# Patient Record
Sex: Female | Born: 2000 | Race: White | Hispanic: Yes | Marital: Single | Smoking: Never smoker
Health system: Southern US, Community
[De-identification: ages and names within clinical notes are randomized; demographics above are authoritative.]

## PROBLEM LIST (undated history)

## (undated) DIAGNOSIS — F32A Depression, unspecified: Secondary | ICD-10-CM

## (undated) DIAGNOSIS — F419 Anxiety disorder, unspecified: Secondary | ICD-10-CM

## (undated) HISTORY — PX: WISDOM TOOTH EXTRACTION: SHX21

## (undated) HISTORY — DX: Depression, unspecified: F32.A

## (undated) HISTORY — DX: Anxiety disorder, unspecified: F41.9

---
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
## 2021-08-06 DIAGNOSIS — H0288A Meibomian gland dysfunction right eye, upper and lower eyelids: Secondary | ICD-10-CM | POA: Diagnosis not present

## 2021-08-06 DIAGNOSIS — H16223 Keratoconjunctivitis sicca, not specified as Sjogren's, bilateral: Secondary | ICD-10-CM | POA: Diagnosis not present

## 2021-08-06 DIAGNOSIS — H0288B Meibomian gland dysfunction left eye, upper and lower eyelids: Secondary | ICD-10-CM | POA: Diagnosis not present

## 2021-09-26 DIAGNOSIS — Z23 Encounter for immunization: Secondary | ICD-10-CM | POA: Diagnosis not present

## 2021-11-30 DIAGNOSIS — M940 Chondrocostal junction syndrome [Tietze]: Secondary | ICD-10-CM | POA: Diagnosis not present

## 2021-12-09 DIAGNOSIS — Z23 Encounter for immunization: Secondary | ICD-10-CM | POA: Diagnosis not present

## 2022-06-10 DIAGNOSIS — Z124 Encounter for screening for malignant neoplasm of cervix: Secondary | ICD-10-CM | POA: Diagnosis not present

## 2022-06-10 DIAGNOSIS — Z6820 Body mass index (BMI) 20.0-20.9, adult: Secondary | ICD-10-CM | POA: Diagnosis not present

## 2022-06-10 DIAGNOSIS — Z01419 Encounter for gynecological examination (general) (routine) without abnormal findings: Secondary | ICD-10-CM | POA: Diagnosis not present

## 2022-06-10 DIAGNOSIS — R87610 Atypical squamous cells of undetermined significance on cytologic smear of cervix (ASC-US): Secondary | ICD-10-CM | POA: Diagnosis not present

## 2022-06-10 DIAGNOSIS — Z113 Encounter for screening for infections with a predominantly sexual mode of transmission: Secondary | ICD-10-CM | POA: Diagnosis not present

## 2022-06-30 LAB — HM PAP SMEAR
Chlamydia, Swab/Urine, PCR: NEGATIVE
HM Pap smear: ABNORMAL
HPV, high-risk: NEGATIVE

## 2022-07-15 NOTE — Progress Notes (Signed)
Subjective:    Sarah Holt is a 22 y.o. female and is here for a comprehensive physical exam.  HPI  Health Maintenance Due  Topic Date Due   HPV VACCINES (1 - 2-dose series) Never done   HIV Screening  Never done   Hepatitis C Screening  Never done   DTaP/Tdap/Td (1 - Tdap) Never done    Acute Concerns: None  Chronic Issues: Depression Treated with fluoxetine 40 mg daily.  No negative side effects but believes she no longer needs this. Would like to taper this and eventually stop. Denies SI/HI  Asthma Longstanding history since elementary school. Treated with albuterol inhaler as needed. Uses sparingly if outside for long periods, exercising.  Chest pain Has a history of chest pain episodes that last minutes usually and hours rarely. Rates pain at 2-3/10. Seen at urgent care for costochondritis 11/2021. During this episode she had associated dizziness, lightheadedness but believes this was due to anxiety. No related trauma.  Fatigue; Syncope Still having issues with this. Has had 3 episodes of syncope in the past 5 years. Episodes last less than a minute. No associated triggers. Has accompanying lightheadedness. Hydration is adequate. Seen by neurologist in the past. Took Adderall at one point but stopped due to associated hyperactivity. No known cardiac issues.  Will be sending vaccine records. Requesting refill of [name]. Does not see dermatologist regularly- no concerns. Does not wear sunscreen daily. Denies unusual headaches, swelling in ankles.  Health Maintenance: Immunizations -- Administered tetanus vaccine. Colonoscopy -- N/A Mammogram -- N/A PAP -- Due for first screening. Bone Density -- N/A Diet -- Healthy. Exercise -- Does cardio, weight training.  Sleep habits -- Stable Mood -- Stable  UTD with dentist? - UTD UTD with eye doctor? - UTD  Weight history: Wt Readings from Last 10 Encounters:  07/22/22 107 lb (48.5 kg)  07/03/19  101 lb 10.1 oz (46.1 kg) (7 %, Z= -1.51)*  04/21/19 103 lb 9.9 oz (47 kg) (10 %, Z= -1.31)*  01/16/19 105 lb 2.6 oz (47.7 kg) (13 %, Z= -1.14)*   * Growth percentiles are based on CDC (Girls, 2-20 Years) data.   Body mass index is 20.9 kg/m. Patient's last menstrual period was 06/27/2022 (approximate).  Alcohol use:  reports current alcohol use of about 1.0 standard drink of alcohol per week.  Tobacco use:  Tobacco Use: Low Risk  (07/22/2022)   Patient History    Smoking Tobacco Use: Never    Smokeless Tobacco Use: Never    Passive Exposure: Not on file   Eligible for lung cancer screening? No     07/22/2022    9:47 AM  Depression screen PHQ 2/9  Decreased Interest 0  Down, Depressed, Hopeless 0  PHQ - 2 Score 0  Altered sleeping 1  Tired, decreased energy 1  Change in appetite 0  Feeling bad or failure about yourself  0  Trouble concentrating 0  Moving slowly or fidgety/restless 0  Suicidal thoughts 0  PHQ-9 Score 2  Difficult doing work/chores Somewhat difficult     Other providers/specialists: Patient Care Team: Jarold Motto, [place] as PCP - General (Physician Assistant)    PMHx, SurgHx, SocialHx, Medications, and Allergies were reviewed in the Visit Navigator and updated as appropriate.   Past Medical History:  Diagnosis Date   Anxiety    Asthma    Depression    Gastritis 2020   Irritable bowel syndrome 2020     Past Surgical History:  Procedure Laterality  Date   UPPER GI ENDOSCOPY  2020   WISDOM TOOTH EXTRACTION       Family History  Problem Relation Age of Onset   Arthritis Mother    Arthritis Father    Asthma Brother    Hearing loss Brother    Migraines Maternal Grandmother    Depression Maternal Grandmother    Cancer Maternal Grandmother        skin; basal cell   Arthritis Maternal Grandmother    Cancer Maternal Grandfather        brain   Depression Maternal Grandfather    Arthritis Paternal Grandmother    Hearing loss Paternal  Grandmother    Depression Paternal Grandfather    Hearing loss Paternal Grandfather    Autism Other    Seizures Neg Hx    ADD / ADHD Neg Hx    Anxiety disorder Neg Hx    Bipolar disorder Neg Hx    Schizophrenia Neg Hx     Social History   Tobacco Use   Smoking status: Never   Smokeless tobacco: Never  Vaping Use   Vaping Use: Never used  Substance Use Topics   Alcohol use: Yes    Alcohol/week: 1.0 standard drink of alcohol    Types: 1 Shots of liquor per week   Drug use: Never    Review of Systems:   Review of Systems  Constitutional:  Positive for malaise/fatigue. Negative for chills, fever and weight loss.  HENT:  Negative for hearing loss, sinus pain and sore throat.   Respiratory:  Negative for cough, hemoptysis and shortness of breath.   Cardiovascular:  Negative for chest pain, palpitations, leg swelling and PND.  Gastrointestinal:  Negative for abdominal pain, constipation, diarrhea, heartburn, nausea and vomiting.  Genitourinary:  Negative for dysuria, frequency and urgency.  Musculoskeletal:  Negative for back pain, myalgias and neck pain.  Skin:  Negative for itching and rash.  Neurological:  Negative for dizziness, tingling, seizures and headaches.       (+) Syncopal episodes  Endo/Heme/Allergies:  Negative for polydipsia.  Psychiatric/Behavioral:  Negative for depression. The patient is not nervous/anxious.     Objective:   BP 100/70 (BP Location: Left Arm, Patient Position: Sitting, Cuff Size: Normal)   Pulse 67   Temp 97.7 F (36.5 C) (Temporal)   Ht 5' (1.524 m)   Wt 107 lb (48.5 kg)   LMP 06/27/2022 (Approximate)   SpO2 98%   BMI 20.90 kg/m  Body mass index is 20.9 kg/m.   General Appearance:    Alert, cooperative, no distress, appears stated age  Head:    Normocephalic, without obvious abnormality, atraumatic  Eyes:    PERRL, conjunctiva/corneas clear, EOM's intact, fundi    benign, both eyes  Ears:    Normal TM's and external ear canals,  both ears  Nose:   Nares normal, septum midline, mucosa normal, no drainage    or sinus tenderness  Throat:   Lips, mucosa, and tongue normal; teeth and gums normal  Neck:   Supple, symmetrical, trachea midline, no adenopathy;    thyroid:  no enlargement/tenderness/nodules; no carotid   bruit or JVD  Back:     Symmetric, no curvature, ROM normal, no CVA tenderness  Lungs:     Clear to auscultation bilaterally, respirations unlabored  Chest Wall:    No deformity Tenderness to palpation to mid sternum - no obvious deformity   Heart:    Regular rate and rhythm, S1 and S2 normal, no murmur,  rub or gallop  Breast Exam:    Deferred  Abdomen:     Soft, non-tender, bowel sounds active all four quadrants,    no masses, no organomegaly  Genitalia:    Deferred  Extremities:   Extremities normal, atraumatic, no cyanosis or edema  Pulses:   2+ and symmetric all extremities  Skin:   Skin color, texture, turgor normal, no rashes or lesions  Lymph nodes:   Cervical, supraclavicular, and axillary nodes normal  Neurologic:   CNII-XII intact, normal strength, sensation and reflexes    throughout    Assessment/Plan:   Routine physical examination Today patient counseled on age appropriate routine health concerns for screening and prevention, each reviewed and up to date or declined. Immunizations reviewed and up to date or declined. Labs ordered and reviewed. Risk factors for depression reviewed and negative. Hearing function and visual acuity are intact. ADLs screened and addressed as needed. Functional ability and level of safety reviewed and appropriate. Education, counseling and referrals performed based on assessed risks today. Patient provided with a copy of personalized plan for preventive services.  Fatigue, unspecified type Unclear etiology This was discussed at the very last minute of our visit Update blood work to rule out organic cause Follow-up if persists so we can have more in-depth  discussion  Syncope, unspecified syncope type EKG tracing is personally reviewed.  EKG notes NSR.  No acute changes.  Unclear etiology Referral to cardiology Update blood work today If new/worsening symptom(s), recommend reaching out and we will provide recommendations accordingly   Anxiety and depression No red flags No SI/HI Reduce Prozac to 20 mg daily Reach out if interested in further reduction I discussed with patient that if they develop any SI, to tell someone immediately and seek medical attention.  Need for prophylactic vaccination with combined diphtheria-tetanus-pertussis (DTP) vaccine Updated today  Mild intermittent asthma without complication Refill albuterol for patient Overall well controlled  Chest pain, unspecified type EKG tracing is personally reviewed.  EKG notes NSR.  No acute changes.  Suspect costochondritis  Recommend NSAIDs as needed If new/worsening symptom(s), recommend reach out to determine next steps  I,Alexander Ruley,acting as a scribe for [employer], PA.,have documented all relevant documentation on the behalf of Jarold Motto, PA,as directed by  Jarold Motto, PA while in the presence of Jarold Motto, [place].   I, Jarold Motto, [place], have reviewed all documentation for this visit. The documentation on 07/22/22 for the exam, diagnosis, procedures, and orders are all accurate and complete.    Jarold Motto, PA-C [facility]

## 2022-07-22 ENCOUNTER — Encounter: Payer: Self-pay | Admitting: Physician Assistant

## 2022-07-22 ENCOUNTER — Ambulatory Visit: Payer: BC Managed Care – PPO | Admitting: Physician Assistant

## 2022-07-22 VITALS — BP 100/70 | HR 67 | Temp 97.7°F | Ht 60.0 in | Wt 107.0 lb

## 2022-07-22 DIAGNOSIS — R079 Chest pain, unspecified: Secondary | ICD-10-CM

## 2022-07-22 DIAGNOSIS — Z0000 Encounter for general adult medical examination without abnormal findings: Secondary | ICD-10-CM

## 2022-07-22 DIAGNOSIS — R55 Syncope and collapse: Secondary | ICD-10-CM

## 2022-07-22 DIAGNOSIS — F419 Anxiety disorder, unspecified: Secondary | ICD-10-CM

## 2022-07-22 DIAGNOSIS — Z23 Encounter for immunization: Secondary | ICD-10-CM

## 2022-07-22 DIAGNOSIS — Z0001 Encounter for general adult medical examination with abnormal findings: Secondary | ICD-10-CM

## 2022-07-22 DIAGNOSIS — R5383 Other fatigue: Secondary | ICD-10-CM

## 2022-07-22 DIAGNOSIS — J4520 Mild intermittent asthma, uncomplicated: Secondary | ICD-10-CM

## 2022-07-22 DIAGNOSIS — F32A Depression, unspecified: Secondary | ICD-10-CM

## 2022-07-22 LAB — LIPID PANEL
Cholesterol: 153 mg/dL (ref 0–200)
HDL: 51.8 mg/dL (ref >39.00)
LDL Cholesterol: 88 mg/dL (ref 0–99)
NonHDL: 101.14
Total CHOL/HDL Ratio: 3
Triglycerides: 66 mg/dL (ref 0.0–149.0)
VLDL: 13.2 mg/dL (ref 0.0–40.0)

## 2022-07-22 LAB — VITAMIN D 25 HYDROXY (VIT D DEFICIENCY, FRACTURES): VITD: 53.59 ng/mL (ref 30.00–100.00)

## 2022-07-22 LAB — CBC WITH DIFFERENTIAL/PLATELET
Basophils Absolute: 0 10*3/uL (ref 0.0–0.1)
Basophils Relative: 0.3 % (ref 0.0–3.0)
Eosinophils Absolute: 0.1 10*3/uL (ref 0.0–0.7)
Eosinophils Relative: 2.6 % (ref 0.0–5.0)
HCT: 39.1 % (ref 36.0–46.0)
Hemoglobin: 13 g/dL (ref 12.0–15.0)
Lymphocytes Relative: 42.1 % (ref 12.0–46.0)
Lymphs Abs: 2.1 10*3/uL (ref 0.7–4.0)
MCHC: 33.1 g/dL (ref 30.0–36.0)
MCV: 92.2 fl (ref 78.0–100.0)
Monocytes Absolute: 0.3 10*3/uL (ref 0.1–1.0)
Monocytes Relative: 5.5 % (ref 3.0–12.0)
Neutro Abs: 2.5 10*3/uL (ref 1.4–7.7)
Neutrophils Relative %: 49.5 % (ref 43.0–77.0)
Platelets: 222 10*3/uL (ref 150.0–400.0)
RBC: 4.25 Mil/uL (ref 3.87–5.11)
RDW: 12.5 % (ref 11.5–15.5)
WBC: 5.1 10*3/uL (ref 4.0–10.5)

## 2022-07-22 LAB — COMPREHENSIVE METABOLIC PANEL
ALT: 19 U/L (ref 0–35)
AST: 19 U/L (ref 0–37)
Albumin: 4.3 g/dL (ref 3.5–5.2)
Alkaline Phosphatase: 37 U/L — ABNORMAL LOW (ref 39–117)
BUN: 12 mg/dL (ref 6–23)
CO2: 24 mEq/L (ref 19–32)
Calcium: 9 mg/dL (ref 8.4–10.5)
Chloride: 104 mEq/L (ref 96–112)
Creatinine, Ser: 0.61 mg/dL (ref 0.40–1.20)
GFR: 128.02 mL/min (ref >60.00)
Glucose, Bld: 88 mg/dL (ref 70–99)
Potassium: 3.6 mEq/L (ref 3.5–5.1)
Sodium: 138 mEq/L (ref 135–145)
Total Bilirubin: 0.8 mg/dL (ref 0.2–1.2)
Total Protein: 6.8 g/dL (ref 6.0–8.3)

## 2022-07-22 LAB — IBC + FERRITIN
Ferritin: 30.3 ng/mL (ref 10.0–291.0)
Iron: 144 ug/dL (ref 42–145)
Saturation Ratios: 33.3 % (ref 20.0–50.0)
TIBC: 432.6 ug/dL (ref 250.0–450.0)
Transferrin: 309 mg/dL (ref 212.0–360.0)

## 2022-07-22 LAB — VITAMIN B12: Vitamin B-12: 253 pg/mL (ref 211–911)

## 2022-07-22 LAB — TSH: TSH: 1.71 u[IU]/mL (ref 0.35–5.50)

## 2022-07-22 MED ORDER — FLUOXETINE HCL 20 MG PO CAPS: 20.0000 mg | ORAL_CAPSULE | Freq: Every day | ORAL | 3 refills | Status: DC

## 2022-07-22 MED ORDER — HAILEY 24 FE 1-20 MG-MCG(24) PO TABS: 1.0000 | ORAL_TABLET | Freq: Every day | ORAL | 3 refills | Status: DC

## 2022-07-22 MED ORDER — ALBUTEROL SULFATE HFA 108 (90 BASE) MCG/ACT IN AERS: 1.0000 | INHALATION_SPRAY | RESPIRATORY_TRACT | 1 refills | Status: AC | PRN

## 2022-07-22 NOTE — Patient Instructions (Addendum)
It was great to see you!  Decrease Prozac to 20 mg daily Consider virtual [facility] for [facility] control refills while in [place] -- you can always come see me during holidays for futher refills as well if you are in the area  Cardiology referral placed  Keep an eye on your chest - if symptoms persist, let me know   Please go to the lab for blood work.   Our office will call you with your results unless you have chosen to receive results via MyChart.  If your blood work is normal we will follow-up each year for physicals and as scheduled for chronic medical problems.  If anything is abnormal we will treat accordingly and get you in for a follow-up.  Take care,  Lelon Mast

## 2022-07-28 ENCOUNTER — Encounter: Payer: Self-pay | Admitting: Cardiology

## 2022-07-28 ENCOUNTER — Ambulatory Visit: Payer: BC Managed Care – PPO | Admitting: Cardiology

## 2022-07-28 VITALS — BP 102/65 | HR 76 | Resp 16 | Ht 60.0 in | Wt 108.0 lb

## 2022-07-28 DIAGNOSIS — R55 Syncope and collapse: Secondary | ICD-10-CM

## 2022-07-28 NOTE — Progress Notes (Signed)
Primary Physician/Referring:  Jarold Motto, PA  Patient ID: Sarah Holt, female    DOB: March 26, 2000, 22 y.o.   MRN: [id number]  No chief complaint on file.  HPI:    Sarah Holt  is a 22 y.o. Caucasian female patient with depression, controlled: Fluoxetine longstanding reactive airway disease and asthma being treated with albuterol on a as needed basis, referred to me for evaluation of recurrent episodes of syncope, 2 episodes in the past 1.5 years.  Both episodes occurred when the patient got up from sitting posture and while walking felt dizzy, diaphoretic and felt like she is going to pass out followed by brief syncope.  No chest pain, no dyspnea, she continues to exercise on a regular basis without any restrictions.  There is no family history of sudden cardiac death.  She has not had any recurrence since last episode sometime in February 2023.  Past Medical History:  Diagnosis Date   Anxiety    Asthma    Depression    Gastritis 2020   Irritable bowel syndrome 2020   Past Surgical History:  Procedure Laterality Date   UPPER GI ENDOSCOPY  2020   WISDOM TOOTH EXTRACTION     Family History  Problem Relation Age of Onset   Arthritis Mother    Arthritis Father    Asthma Brother    Hearing loss Brother    Migraines Maternal Grandmother    Depression Maternal Grandmother    Cancer Maternal Grandmother        skin; basal cell   Arthritis Maternal Grandmother    Cancer Maternal Grandfather        brain   Depression Maternal Grandfather    Arthritis Paternal Grandmother    Hearing loss Paternal Grandmother    Depression Paternal Grandfather    Hearing loss Paternal Grandfather    Autism Other    Seizures Neg Hx    ADD / ADHD Neg Hx    Anxiety disorder Neg Hx    Bipolar disorder Neg Hx    Schizophrenia Neg Hx     Social History   Tobacco Use   Smoking status: Never   Smokeless tobacco: Never  Substance Use Topics   Alcohol use: Yes    Alcohol/week: 1.0  standard drink of alcohol    Types: 1 Shots of liquor per week   Marital Status: Single  ROS  Review of Systems  Cardiovascular:  Negative for chest pain, dyspnea on exertion and leg swelling.   Objective      07/22/2022    9:44 AM 07/03/2019   12:17 PM 04/21/2019    2:50 PM  Vitals with BMI  Height 5\' 0"  4' 11.843" 4' 11.449"  Weight 107 lbs 101 lbs 10 oz 103 lbs 10 oz  BMI 20.9 19.95 20.61  Systolic 100 102 440  Diastolic 70 64 70  Pulse 67 70 68   Last menstrual period 06/27/2022.  Physical Exam Neck:     Vascular: No carotid bruit or JVD.  Cardiovascular:     Rate and Rhythm: Normal rate and regular rhythm.     Pulses: Intact distal pulses.     Heart sounds: Normal heart sounds. No murmur heard.    No gallop.  Pulmonary:     Effort: Pulmonary effort is normal.     Breath sounds: Normal breath sounds.  Abdominal:     General: Bowel sounds are normal.     Palpations: Abdomen is soft.  Musculoskeletal:  Right lower leg: No edema.     Left lower leg: No edema.     Laboratory examination:   Recent Labs    07/22/22 1017  NA 138  K 3.6  CL 104  CO2 24  GLUCOSE 88  BUN 12  CREATININE 0.61  CALCIUM 9.0    Lab Results  Component Value Date   GLUCOSE 88 07/22/2022   NA 138 07/22/2022   K 3.6 07/22/2022   CL 104 07/22/2022   CO2 24 07/22/2022   BUN 12 07/22/2022   CREATININE 0.61 07/22/2022   CALCIUM 9.0 07/22/2022   PROT 6.8 07/22/2022   ALBUMIN 4.3 07/22/2022   BILITOT 0.8 07/22/2022   ALKPHOS 37 (L) 07/22/2022   AST 19 07/22/2022   ALT 19 07/22/2022      Lab Results  Component Value Date   ALT 19 07/22/2022   AST 19 07/22/2022   ALKPHOS 37 (L) 07/22/2022   BILITOT 0.8 07/22/2022       Latest Ref Rng & Units 07/22/2022   10:17 AM  CBC  WBC 4.0 - 10.5 K/uL 5.1   Hemoglobin 12.0 - 15.0 g/dL 78.4   Hematocrit 69.6 - 46.0 % 39.1   Platelets 150.0 - 400.0 K/uL 222.0        Latest Ref Rng & Units 07/22/2022   10:17 AM  Hepatic  Function  Total Protein 6.0 - 8.3 g/dL 6.8   Albumin 3.5 - 5.2 g/dL 4.3   AST 0 - 37 U/L 19   ALT 0 - 35 U/L 19   Alk Phosphatase 39 - 117 U/L 37   Total Bilirubin 0.2 - 1.2 mg/dL 0.8     Lipid Panel Recent Labs    07/22/22 1017  CHOL 153  TRIG 66.0  LDLCALC 88  VLDL 13.2  HDL 51.80  CHOLHDL 3    HEMOGLOBIN A1C No results found for: "HGBA1C", "MPG" TSH Recent Labs    07/22/22 1017  TSH 1.71   Radiology:    Cardiac Studies:   NA  EKG:   EKG 07/28/2022: Normal sinus rhythm with rate of 71 bpm, normal EKG.  Compared to 07/22/2022, no change.  Medications and allergies  No Known Allergies   Medication list   Current Outpatient Medications:    albuterol (PROAIR HFA) 108 (90 Base) MCG/ACT inhaler, Inhale 1-2 puffs into the lungs every 4 (four) hours as needed., Disp: 18 g, Rfl: 1   ascorbic acid (VITAMIN C) 250 MG tablet, Take 250 mg by mouth 2 (two) times daily., Disp: , Rfl:    FLUoxetine (PROZAC) 20 MG capsule, Take 1 capsule (20 mg total) by mouth daily., Disp: 90 capsule, Rfl: 3   [name] 24 FE 1-20 MG-MCG(24) tablet, Take 1 tablet by mouth daily., Disp: 84 tablet, Rfl: 3   ibuprofen (ADVIL) 200 MG tablet, Take 200 mg by mouth every 6 (six) hours as needed., Disp: , Rfl:   Assessment  No diagnosis found.   No orders of the defined types were placed in this encounter.  No orders of the defined types were placed in this encounter.  There are no discontinued medications.   Recommendations:   Sarah Holt is a 22 y.o. Caucasian female patient with depression, controlled: Fluoxetine longstanding reactive airway disease and asthma being treated with albuterol on a as needed basis, referred to me for evaluation of recurrent episodes of syncope, 2 episodes in the past 1.5 years.  Last episode in February 2023 with no recurrence.  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  Do not suspect any cardiac abnormality, first episode occurred in  2022 and second episode 6 months later in 2023, both episode she got up and she was walking when she had syncope.  First episode occurred after she was returning back from the beach and was trying to use the restroom when she passed out.  Both episodes preceded dizziness, feeling faint and mild diaphoresis.  Counterpressure maneuvers discussed, hydration discussed with the patient, in view of normal EKG, normal physical exam, would not recommend any further cardiac workup for now unless there is recurrence.  I will see her back on a as needed basis.    Yates Decamp, MD, [facility] 07/28/2022, 12:57 PM Office: [phone]

## 2022-09-25 ENCOUNTER — Encounter: Payer: Self-pay | Admitting: Physician Assistant

## 2022-10-14 DIAGNOSIS — Z30011 Encounter for initial prescription of contraceptive pills: Secondary | ICD-10-CM | POA: Diagnosis not present

## 2022-11-09 ENCOUNTER — Other Ambulatory Visit: Payer: Self-pay | Admitting: Physician Assistant

## 2022-11-09 ENCOUNTER — Encounter: Payer: Self-pay | Admitting: Physician Assistant

## 2022-11-09 MED ORDER — FLUOXETINE HCL 40 MG PO CAPS: 40.0000 mg | ORAL_CAPSULE | Freq: Every day | ORAL | 3 refills | Status: DC

## 2023-04-12 ENCOUNTER — Telehealth: Payer: Self-pay | Admitting: *Deleted

## 2023-04-12 DIAGNOSIS — Z0000 Encounter for general adult medical examination without abnormal findings: Secondary | ICD-10-CM

## 2023-04-12 DIAGNOSIS — R5383 Other fatigue: Secondary | ICD-10-CM

## 2023-04-12 NOTE — Telephone Encounter (Signed)
Please see message and advise lab orders.

## 2023-04-12 NOTE — Telephone Encounter (Signed)
 Copied from CRM [id number]. Topic: Clinical - Request for Lab/Test Order >> Apr 09, 2023  4:05 PM Almira Coaster wrote: Reason for CRM: Patient is scheduled for her annual physical on 08/13/2023 and CPE labs on 08/09/2023 but PA Jarold Motto will be out of the office on 08/09/2023. She would like to know if we're able to have orders placed to have labs done.

## 2023-04-13 NOTE — Telephone Encounter (Signed)
 Pt is not needing any other orders rather than what is normally done during a physical.

## 2023-04-13 NOTE — Telephone Encounter (Signed)
 Orders have been placed.

## 2023-04-13 NOTE — Addendum Note (Signed)
 Addended by: Jimmye Norman on: 04/13/2023 09:16 AM   Modules accepted: Orders

## 2023-04-13 NOTE — Addendum Note (Signed)
 Addended by: Jimmye Norman on: 04/13/2023 08:59 AM   Modules accepted: Orders

## 2023-04-13 NOTE — Telephone Encounter (Signed)
 Left message on voicemail to call office.

## 2023-08-09 ENCOUNTER — Other Ambulatory Visit (INDEPENDENT_AMBULATORY_CARE_PROVIDER_SITE_OTHER)

## 2023-08-09 DIAGNOSIS — Z0000 Encounter for general adult medical examination without abnormal findings: Secondary | ICD-10-CM

## 2023-08-09 DIAGNOSIS — Z13220 Encounter for screening for lipoid disorders: Secondary | ICD-10-CM | POA: Diagnosis not present

## 2023-08-09 DIAGNOSIS — R5383 Other fatigue: Secondary | ICD-10-CM

## 2023-08-09 LAB — COMPREHENSIVE METABOLIC PANEL WITH GFR
ALT: 16 U/L (ref 0–35)
AST: 20 U/L (ref 0–37)
Albumin: 4.3 g/dL (ref 3.5–5.2)
Alkaline Phosphatase: 35 U/L — ABNORMAL LOW (ref 39–117)
BUN: 14 mg/dL (ref 6–23)
CO2: 21 meq/L (ref 19–32)
Calcium: 8.9 mg/dL (ref 8.4–10.5)
Chloride: 104 meq/L (ref 96–112)
Creatinine, Ser: 0.58 mg/dL (ref 0.40–1.20)
GFR: 128.63 mL/min (ref >60.00)
Glucose, Bld: 89 mg/dL (ref 70–99)
Potassium: 3.8 meq/L (ref 3.5–5.1)
Sodium: 136 meq/L (ref 135–145)
Total Bilirubin: 0.7 mg/dL (ref 0.2–1.2)
Total Protein: 6.9 g/dL (ref 6.0–8.3)

## 2023-08-09 LAB — CBC WITH DIFFERENTIAL/PLATELET
Basophils Absolute: 0 K/uL (ref 0.0–0.1)
Basophils Relative: 0.4 % (ref 0.0–3.0)
Eosinophils Absolute: 0.2 K/uL (ref 0.0–0.7)
Eosinophils Relative: 4.3 % (ref 0.0–5.0)
HCT: 37.2 % (ref 36.0–46.0)
Hemoglobin: 12.7 g/dL (ref 12.0–15.0)
Lymphocytes Relative: 44.8 % (ref 12.0–46.0)
Lymphs Abs: 2.6 K/uL (ref 0.7–4.0)
MCHC: 34 g/dL (ref 30.0–36.0)
MCV: 89.4 fl (ref 78.0–100.0)
Monocytes Absolute: 0.4 K/uL (ref 0.1–1.0)
Monocytes Relative: 6.2 % (ref 3.0–12.0)
Neutro Abs: 2.6 K/uL (ref 1.4–7.7)
Neutrophils Relative %: 44.3 % (ref 43.0–77.0)
Platelets: 209 K/uL (ref 150.0–400.0)
RBC: 4.16 Mil/uL (ref 3.87–5.11)
RDW: 12.8 % (ref 11.5–15.5)
WBC: 5.8 K/uL (ref 4.0–10.5)

## 2023-08-09 LAB — LIPID PANEL
Cholesterol: 144 mg/dL (ref 0–200)
HDL: 53.2 mg/dL (ref >39.00)
LDL Cholesterol: 80 mg/dL (ref 0–99)
NonHDL: 90.54
Total CHOL/HDL Ratio: 3
Triglycerides: 55 mg/dL (ref 0.0–149.0)
VLDL: 11 mg/dL (ref 0.0–40.0)

## 2023-08-10 ENCOUNTER — Ambulatory Visit: Payer: Self-pay | Admitting: Physician Assistant

## 2023-08-13 ENCOUNTER — Ambulatory Visit (INDEPENDENT_AMBULATORY_CARE_PROVIDER_SITE_OTHER): Admitting: Physician Assistant

## 2023-08-13 VITALS — BP 110/68 | HR 73 | Temp 98.2°F | Ht 60.0 in | Wt 112.2 lb

## 2023-08-13 DIAGNOSIS — F32A Depression, unspecified: Secondary | ICD-10-CM | POA: Diagnosis not present

## 2023-08-13 DIAGNOSIS — Z0000 Encounter for general adult medical examination without abnormal findings: Secondary | ICD-10-CM

## 2023-08-13 DIAGNOSIS — F419 Anxiety disorder, unspecified: Secondary | ICD-10-CM

## 2023-08-13 DIAGNOSIS — G4719 Other hypersomnia: Secondary | ICD-10-CM | POA: Diagnosis not present

## 2023-08-13 MED ORDER — AMPHETAMINE-DEXTROAMPHET ER 10 MG PO CP24: 10.0000 mg | ORAL_CAPSULE | Freq: Every day | ORAL | 0 refills | Status: DC

## 2023-08-13 MED ORDER — FLUOXETINE HCL 20 MG PO CAPS: 20.0000 mg | ORAL_CAPSULE | Freq: Every day | ORAL | 0 refills | Status: DC

## 2023-08-13 NOTE — Progress Notes (Signed)
 Subjective:    Sarah Holt is a 23 y.o. female and is here for a comprehensive physical exam.  HPI  Health Maintenance Due  Topic Date Due   HPV VACCINES (1 - 3-dose series) Never done   HIV Screening  Never done   Hepatitis C Screening  Never done   CHLAMYDIA SCREENING  06/10/2023    Acute Concerns: No major concerns  Chronic Issues: Anxiety and depression Continues on Prozac  40 mg daily Feels like it may be affected sex drive Would like to trial decrease  Hypersomnia Having issues with staying awake She increased the Prozac  from 20 to 40 mg daily and this helped some About to start law school -- was on Adderall XR 10 mg daily in past and would like to consider restarting this  Health Maintenance: Immunizations -- UpToDate  Colonoscopy -- n/a Mammogram -- n/a PAP -- completed 06/10/22  Bone Density --  Diet -- avoids milk Exercise -- bicycle; soccer, gym  Sleep habits -- no major concerns Mood -- overall stable  UTD with dentist? - yes UTD with eye doctor? - yes  Weight history: Wt Readings from Last 10 Encounters:  08/13/23 112 lb 3.2 oz (50.9 kg)  07/28/22 108 lb (49 kg)  07/22/22 107 lb (48.5 kg)  07/03/19 101 lb 10.1 oz (46.1 kg) (7%, Z= -1.51)*  04/21/19 103 lb 9.9 oz (47 kg) (10%, Z= -1.31)*  01/16/19 105 lb 2.6 oz (47.7 kg) (13%, Z= -1.14)*   * Growth percentiles are based on CDC (Girls, 2-20 Years) data.   Body mass index is 21.91 kg/m. No LMP recorded.  Alcohol use:  reports current alcohol use of about 1.0 standard drink of alcohol per week.  Tobacco use:  Tobacco Use: Low Risk  (07/28/2022)   Patient History    Smoking Tobacco Use: Never    Smokeless Tobacco Use: Never    Passive Exposure: Not on file   Eligible for lung cancer screening? no     08/13/2023    1:34 PM  Depression screen PHQ 2/9  Decreased Interest 0  PHQ - 2 Score 0  Altered sleeping 2  Tired, decreased energy 1  Change in appetite 0  Feeling bad or failure  about yourself  0  Trouble concentrating 0  Moving slowly or fidgety/restless 0  Suicidal thoughts 0  PHQ-9 Score 3  Difficult doing work/chores Somewhat difficult     Other providers/specialists: Patient Care Team: Job Lukes, [place] as PCP - General (Physician Assistant)    PMHx, SurgHx, SocialHx, Medications, and Allergies were reviewed in the Visit Navigator and updated as appropriate.   Past Medical History:  Diagnosis Date   Anxiety    Asthma    Depression    Gastritis 2020   Irritable bowel syndrome 2020     Past Surgical History:  Procedure Laterality Date   UPPER GI ENDOSCOPY  2020   WISDOM TOOTH EXTRACTION       Family History  Problem Relation Age of Onset   Arthritis Mother    Arthritis Father    Asthma Brother    Hearing loss Brother    Migraines Maternal Grandmother    Depression Maternal Grandmother    Cancer Maternal Grandmother        skin; basal cell   Arthritis Maternal Grandmother    Cancer Maternal Grandfather        brain   Depression Maternal Grandfather    Arthritis Paternal Grandmother    Hearing loss Paternal  Grandmother    Depression Paternal Grandfather    Hearing loss Paternal Grandfather    Autism Other    Seizures Neg Hx    ADD / ADHD Neg Hx    Anxiety disorder Neg Hx    Bipolar disorder Neg Hx    Schizophrenia Neg Hx     Social History   Tobacco Use   Smoking status: Never   Smokeless tobacco: Never  Vaping Use   Vaping status: Never Used  Substance Use Topics   Alcohol use: Yes    Alcohol/week: 1.0 standard drink of alcohol    Types: 1 Shots of liquor per week    Comment: occ   Drug use: Never    Review of Systems:   Review of Systems  Constitutional:  Negative for chills, fever, malaise/fatigue and weight loss.  HENT:  Negative for hearing loss, sinus pain and sore throat.   Respiratory:  Negative for cough and hemoptysis.   Cardiovascular:  Negative for chest pain, palpitations, leg swelling and PND.   Gastrointestinal:  Negative for abdominal pain, constipation, diarrhea, heartburn, nausea and vomiting.  Genitourinary:  Negative for dysuria, frequency and urgency.  Musculoskeletal:  Negative for back pain, myalgias and neck pain.  Skin:  Negative for itching and rash.  Neurological:  Negative for dizziness, tingling, seizures and headaches.  Endo/Heme/Allergies:  Negative for polydipsia.  Psychiatric/Behavioral:  Negative for depression. The patient is not nervous/anxious.     Objective:   BP 110/68 (BP Location: Left Arm, Patient Position: Sitting, Cuff Size: Normal)   Pulse 73   Temp 98.2 F (36.8 C) (Temporal)   Ht 5' (1.524 m)   Wt 112 lb 3.2 oz (50.9 kg)   SpO2 99%   BMI 21.91 kg/m  Body mass index is 21.91 kg/m.   General Appearance:    Alert, cooperative, no distress, appears stated age  Head:    Normocephalic, without obvious abnormality, atraumatic  Eyes:    PERRL, conjunctiva/corneas clear, EOM's intact, fundi    benign, both eyes  Ears:    Normal TM's and external ear canals, both ears  Nose:   Nares normal, septum midline, mucosa normal, no drainage    or sinus tenderness  Throat:   Lips, mucosa, and tongue normal; teeth and gums normal  Neck:   Supple, symmetrical, trachea midline, no adenopathy;    thyroid :  no enlargement/tenderness/nodules; no carotid   bruit or JVD  Back:     Symmetric, no curvature, ROM normal, no CVA tenderness  Lungs:     Clear to auscultation bilaterally, respirations unlabored  Chest Wall:    No tenderness or deformity   Heart:    Regular rate and rhythm, S1 and S2 normal, no murmur, rub or gallop  Breast Exam:    Deferred  Abdomen:     Soft, non-tender, bowel sounds active all four quadrants,    no masses, no organomegaly  Genitalia:    Deferred  Extremities:   Extremities normal, atraumatic, no cyanosis or edema  Pulses:   2+ and symmetric all extremities  Skin:   Skin color, texture, turgor normal, no rashes or lesions  Lymph  nodes:   Cervical, supraclavicular, and axillary nodes normal  Neurologic:   CNII-XII intact, normal strength, sensation and reflexes    throughout    Assessment/Plan:   Routine physical examination Today patient counseled on age appropriate routine health concerns for screening and prevention, each reviewed and up to date or declined. Immunizations reviewed and up to  date or declined. Labs ordered and reviewed. Risk factors for depression reviewed and negative. Hearing function and visual acuity are intact. ADLs screened and addressed as needed. Functional ability and level of safety reviewed and appropriate. Education, counseling and referrals performed based on assessed risks today. Patient provided with a copy of personalized plan for preventive services.  Anxiety and depression Stable Decrease Prozac  to 20 mg daily Follow up in 1 year, sooner if concerns  Excessive daytime sleepiness Uncontrolled Will restart her Adderall XR 10 mg  Follow up in 3 month(s), sooner if concerns  Lucie Buttner, PA-C [facility]

## 2023-09-06 ENCOUNTER — Other Ambulatory Visit: Payer: Self-pay | Admitting: Physician Assistant

## 2023-09-06 ENCOUNTER — Encounter: Payer: Self-pay | Admitting: Physician Assistant

## 2023-09-06 MED ORDER — AMPHETAMINE-DEXTROAMPHET ER 10 MG PO CP24: 10.0000 mg | ORAL_CAPSULE | Freq: Every day | ORAL | 0 refills | Status: DC

## 2023-09-06 NOTE — Telephone Encounter (Signed)
 Pt requesting refill for Adderall XR 10 mg daily. Last OV 08/13/2023. Pharmacy has been updated.

## 2023-09-09 ENCOUNTER — Other Ambulatory Visit

## 2023-09-20 MED ORDER — HAILEY 24 FE 1-20 MG-MCG(24) PO TABS: 1.0000 | ORAL_TABLET | Freq: Every day | ORAL | 3 refills | Status: AC

## 2023-10-13 ENCOUNTER — Other Ambulatory Visit: Payer: Self-pay | Admitting: Physician Assistant

## 2023-10-13 MED ORDER — AMPHETAMINE-DEXTROAMPHET ER 10 MG PO CP24: 10.0000 mg | ORAL_CAPSULE | Freq: Every day | ORAL | 0 refills | Status: DC

## 2023-10-13 NOTE — Telephone Encounter (Signed)
 Pt requesting refill for Adderall XR 10 mg. Last OV 08/13/2023.

## 2023-10-29 DIAGNOSIS — Z113 Encounter for screening for infections with a predominantly sexual mode of transmission: Secondary | ICD-10-CM | POA: Diagnosis not present

## 2023-10-29 DIAGNOSIS — Z01419 Encounter for gynecological examination (general) (routine) without abnormal findings: Secondary | ICD-10-CM | POA: Diagnosis not present

## 2023-10-29 DIAGNOSIS — Z124 Encounter for screening for malignant neoplasm of cervix: Secondary | ICD-10-CM | POA: Diagnosis not present

## 2023-11-03 DIAGNOSIS — H02841 Edema of right upper eyelid: Secondary | ICD-10-CM | POA: Diagnosis not present

## 2023-11-03 DIAGNOSIS — H00011 Hordeolum externum right upper eyelid: Secondary | ICD-10-CM | POA: Diagnosis not present

## 2023-11-08 ENCOUNTER — Encounter: Payer: Self-pay | Admitting: Physician Assistant

## 2023-11-08 ENCOUNTER — Other Ambulatory Visit: Payer: Self-pay | Admitting: Physician Assistant

## 2023-11-08 MED ORDER — FLUOXETINE HCL 20 MG PO CAPS: 20.0000 mg | ORAL_CAPSULE | Freq: Every day | ORAL | 1 refills | Status: AC

## 2023-11-08 MED ORDER — AMPHETAMINE-DEXTROAMPHET ER 10 MG PO CP24: 10.0000 mg | ORAL_CAPSULE | Freq: Every day | ORAL | 0 refills | Status: DC

## 2023-11-08 NOTE — Telephone Encounter (Signed)
 Pt requesting refill for Adderall XR 10 mg daily. Last Ov 08/13/2023. Rx for Fluoxetine  was sent to pharmacy.

## 2023-11-15 ENCOUNTER — Telehealth (INDEPENDENT_AMBULATORY_CARE_PROVIDER_SITE_OTHER): Admitting: Physician Assistant

## 2023-11-15 ENCOUNTER — Encounter: Payer: Self-pay | Admitting: Physician Assistant

## 2023-11-15 DIAGNOSIS — F902 Attention-deficit hyperactivity disorder, combined type: Secondary | ICD-10-CM | POA: Diagnosis not present

## 2023-11-15 NOTE — Progress Notes (Signed)
   Virtual Visit via Video Note   I, Lucie Buttner, connected with  [name]  ([id number], 2000/06/24) on 11/15/23 at  8:00 AM EST by a video-enabled telemedicine application and verified that I am speaking with the correct person using two identifiers.  Location: Patient: Home Provider: [facility] [place] [facility]   I discussed the limitations of evaluation and management by telemedicine and the availability of in person appointments. The patient expressed understanding and agreed to proceed.    History of Present Illness: Sarah Holt is a 23 y.o. who identifies as a female who was assigned female at birth, and is being seen today for ADHD.  She is currently taking Adderall XR 10 mg 24 hr daily. She is tolerating this well.  Reports there is no insomnia, palpitations, worsening anxiety, inability to eat.  Problems:  Patient Active Problem List   Diagnosis Date Noted   Attention deficit hyperactivity disorder (ADHD), combined type 11/15/2023   Alteration of awareness 01/16/2019   Excessive daytime sleepiness 01/16/2019   Asthma 08/12/2010   Contusion of toe 08/12/2010    Allergies: No Known Allergies Medications:  Current Outpatient Medications:    albuterol  (PROAIR  HFA) 108 (90 Base) MCG/ACT inhaler, Inhale 1-2 puffs into the lungs every 4 (four) hours as needed., Disp: 18 g, Rfl: 1   amphetamine -dextroamphetamine (ADDERALL XR) 10 MG 24 hr capsule, Take 1 capsule (10 mg total) by mouth daily., Disp: 30 capsule, Rfl: 0   ascorbic acid (VITAMIN C) 250 MG tablet, Take 250 mg by mouth 2 (two) times daily., Disp: , Rfl:    FLUoxetine  (PROZAC ) 20 MG capsule, Take 1 capsule (20 mg total) by mouth daily., Disp: 90 capsule, Rfl: 1   [name]  24 FE 1-20 MG-MCG(24) tablet, Take 1 tablet by mouth daily., Disp: 84 tablet, Rfl: 3   ibuprofen (ADVIL) 200 MG tablet, Take 200 mg by mouth every 6 (six) hours as needed., Disp: , Rfl:   Observations/Objective: Patient is  well-developed, well-nourished in no acute distress.  Resting comfortably  at home.  Head is normocephalic, atraumatic.  No labored breathing.  Speech is clear and coherent with logical content.  Patient is alert and oriented at baseline.   Assessment and Plan: 1. Attention deficit hyperactivity disorder (ADHD), combined type (Primary) PDMP reviewed during this encounter. No red flags Refill Adderall XR 10 mg daily Follow up in 3 month(s), sooner if concerns  Follow Up Instructions: I discussed the assessment and treatment plan with the patient. The patient was provided an opportunity to ask questions and all were answered. The patient agreed with the plan and demonstrated an understanding of the instructions.  A copy of instructions were sent to the patient via MyChart unless otherwise noted below.   The patient was advised to call back or seek an in-person evaluation if the symptoms worsen or if the condition fails to improve as anticipated.  Lucie Buttner, [place]

## 2023-11-16 ENCOUNTER — Encounter: Payer: Self-pay | Admitting: Physician Assistant

## 2023-12-14 ENCOUNTER — Encounter: Payer: Self-pay | Admitting: Physician Assistant

## 2023-12-14 MED ORDER — AMPHETAMINE-DEXTROAMPHET ER 10 MG PO CP24: 10.0000 mg | ORAL_CAPSULE | Freq: Every day | ORAL | 0 refills | Status: AC
